# Patient Record
Sex: Female | Born: 2000 | Race: Black or African American | Hispanic: No | Marital: Single | Smoking: Never smoker
Health system: Southern US, Community
[De-identification: ages and names within clinical notes are randomized; demographics above are authoritative.]

## PROBLEM LIST (undated history)

## (undated) DIAGNOSIS — N63 Unspecified lump in unspecified breast: Secondary | ICD-10-CM

---
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
## 2024-05-12 ENCOUNTER — Emergency Department (HOSPITAL_COMMUNITY)
Admission: EM | Admit: 2024-05-12 | Discharge: 2024-05-13 | Disposition: A | Discharge status: Home / Self Care | Attending: Emergency Medicine | Admitting: Emergency Medicine

## 2024-05-12 ENCOUNTER — Other Ambulatory Visit: Payer: Self-pay

## 2024-05-12 ENCOUNTER — Encounter (HOSPITAL_COMMUNITY): Payer: Self-pay

## 2024-05-12 DIAGNOSIS — T7621XA Adult sexual abuse, suspected, initial encounter: Secondary | ICD-10-CM | POA: Insufficient documentation
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
## 2024-05-13 LAB — WET PREP, GENITAL
Clue Cells Wet Prep HPF POC: NONE SEEN
Sperm: NONE SEEN
Trich, Wet Prep: NONE SEEN
WBC, Wet Prep HPF POC: >=10 — AB (ref <10)
Yeast Wet Prep HPF POC: NONE SEEN

## 2024-05-13 LAB — URINALYSIS, ROUTINE W REFLEX MICROSCOPIC
Bilirubin Urine: NEGATIVE
Glucose, UA: NEGATIVE mg/dL
Hgb urine dipstick: NEGATIVE
Ketones, ur: NEGATIVE mg/dL
Leukocytes,Ua: NEGATIVE
Nitrite: NEGATIVE
Protein, ur: NEGATIVE mg/dL
Specific Gravity, Urine: 1.012 (ref 1.005–1.030)
pH: 6 (ref 5.0–8.0)

## 2024-05-13 LAB — CBC WITH DIFFERENTIAL/PLATELET
Abs Immature Granulocytes: 0.01 K/uL (ref 0.00–0.07)
Basophils Absolute: 0 K/uL (ref 0.0–0.1)
Basophils Relative: 0 %
Eosinophils Absolute: 0.1 K/uL (ref 0.0–0.5)
Eosinophils Relative: 1 %
HCT: 40.6 % (ref 36.0–46.0)
Hemoglobin: 13.7 g/dL (ref 12.0–15.0)
Immature Granulocytes: 0 %
Lymphocytes Relative: 53 %
Lymphs Abs: 2.6 K/uL (ref 0.7–4.0)
MCH: 31.6 pg (ref 26.0–34.0)
MCHC: 33.7 g/dL (ref 30.0–36.0)
MCV: 93.8 fL (ref 80.0–100.0)
Monocytes Absolute: 0.5 K/uL (ref 0.1–1.0)
Monocytes Relative: 10 %
Neutro Abs: 1.8 K/uL (ref 1.7–7.7)
Neutrophils Relative %: 36 %
Platelets: 212 K/uL (ref 150–400)
RBC: 4.33 MIL/uL (ref 3.87–5.11)
RDW: 11.8 % (ref 11.5–15.5)
WBC: 4.9 K/uL (ref 4.0–10.5)
nRBC: 0 % (ref 0.0–0.2)

## 2024-05-13 LAB — BASIC METABOLIC PANEL WITH GFR
Anion gap: 10 (ref 5–15)
BUN: 7 mg/dL (ref 6–20)
CO2: 22 mmol/L (ref 22–32)
Calcium: 9.1 mg/dL (ref 8.9–10.3)
Chloride: 104 mmol/L (ref 98–111)
Creatinine, Ser: 0.72 mg/dL (ref 0.44–1.00)
GFR, Estimated: >60 mL/min (ref >60)
Glucose, Bld: 88 mg/dL (ref 70–99)
Potassium: 3.8 mmol/L (ref 3.5–5.1)
Sodium: 136 mmol/L (ref 135–145)

## 2024-05-13 LAB — GC/CHLAMYDIA PROBE AMP (~~LOC~~) NOT AT ARMC
Chlamydia: NEGATIVE
Comment: NEGATIVE
Comment: NORMAL
Neisseria Gonorrhea: NEGATIVE

## 2024-05-13 LAB — RAPID HIV SCREEN (HIV 1/2 AB+AG)
HIV 1/2 Antibodies: NONREACTIVE
HIV-1 P24 Antigen - HIV24: NONREACTIVE

## 2024-05-13 LAB — HCG, SERUM, QUALITATIVE: Preg, Serum: NEGATIVE

## 2024-05-13 LAB — RPR: RPR Ser Ql: NONREACTIVE

## 2024-05-13 NOTE — ED Notes (Signed)
Pelvic cart outside pt room

## 2024-05-13 NOTE — Discharge Instructions (Signed)
 You were seen in the ER today for evaluation. Please follow up with your MyChart results. Please follow up with your gynecologist. If you have any concerns, new or worsening symptoms, please return to the nearest ER for evaluation.   Get help right away if: You feel like you're in immediate danger. You feel like you may hurt yourself or others. You have thoughts about taking your own life. You have other thoughts or feelings that worry you. These situations or symptoms may be an emergency. Take one of these steps right away: Go to your nearest emergency room. Call 911. Call the Suicide & Crisis Lifeline (free and confidential): Call [phone] or 988. Text (646)447-7605. If you're a Veteran: Call 988 and press 1. Text the [employer] at [phone].

## 2024-05-13 NOTE — ED Notes (Signed)
 Pt stated she is ready to leave because she has been waiting for too long. Informed pt that she is one of the longest waits and I recommends she want until she sees a provider. Pt mother states she is willing to wait 30 more minutes.
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
## 2024-05-14 LAB — URINE CULTURE: Culture: NO GROWTH

## 2024-05-30 ENCOUNTER — Encounter (HOSPITAL_COMMUNITY): Payer: Self-pay

## 2024-05-30 ENCOUNTER — Ambulatory Visit (HOSPITAL_COMMUNITY)
Admission: EM | Admit: 2024-05-30 | Discharge: 2024-05-30 | Disposition: A | Discharge status: Home / Self Care | Attending: Nurse Practitioner | Admitting: Nurse Practitioner

## 2024-05-30 DIAGNOSIS — R300 Dysuria: Secondary | ICD-10-CM | POA: Diagnosis not present

## 2024-05-30 LAB — POCT URINALYSIS DIP (MANUAL ENTRY)
Bilirubin, UA: NEGATIVE
Blood, UA: NEGATIVE
Glucose, UA: NEGATIVE mg/dL
Ketones, POC UA: NEGATIVE mg/dL
Leukocytes, UA: NEGATIVE
Nitrite, UA: NEGATIVE
Protein Ur, POC: 30 mg/dL — AB
Spec Grav, UA: 1.02 (ref 1.010–1.025)
Urobilinogen, UA: 1 U/dL
pH, UA: 7 (ref 5.0–8.0)

## 2024-05-30 LAB — POCT URINE PREGNANCY: Preg Test, Ur: NEGATIVE
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
## 2024-05-30 NOTE — ED Triage Notes (Signed)
 Patient reports that she was told on 05/12/24 that she had a UTI and states she was not prescribed any antibiotics. Patient states she is having dysuria and does not know how long she has had it. Patient stated, I brushed off the

## 2024-05-30 NOTE — Discharge Instructions (Addendum)
 You do not have a urinary tract infection today. If you feel your symptoms are getting worse, are bothersome, and are different than usual, I recommend you follow up with a gynecologist.   I recommend stopping the over the counter AZO medicine for now.
